# Patient Record
Sex: Male | Born: 1950 | Race: White | Hispanic: No | Marital: Single | Smoking: Never smoker
Health system: Southern US, Community
[De-identification: ages and names within clinical notes are randomized; demographics above are authoritative.]

## PROBLEM LIST (undated history)

## (undated) DIAGNOSIS — G35 Multiple sclerosis: Secondary | ICD-10-CM

## (undated) DIAGNOSIS — G35D Multiple sclerosis, unspecified: Secondary | ICD-10-CM

---

## 2017-08-07 ENCOUNTER — Encounter (HOSPITAL_COMMUNITY): Payer: Self-pay | Admitting: Emergency Medicine

## 2017-08-07 ENCOUNTER — Emergency Department (HOSPITAL_COMMUNITY): Payer: Medicare Other

## 2017-08-07 ENCOUNTER — Emergency Department (HOSPITAL_COMMUNITY)
Admission: EM | Admit: 2017-08-07 | Discharge: 2017-08-07 | Disposition: A | Discharge status: Home / Self Care | Payer: Medicare Other | Attending: Emergency Medicine | Admitting: Emergency Medicine

## 2017-08-07 DIAGNOSIS — G35 Multiple sclerosis: Secondary | ICD-10-CM | POA: Insufficient documentation

## 2017-08-07 DIAGNOSIS — R55 Syncope and collapse: Secondary | ICD-10-CM | POA: Diagnosis present

## 2017-08-07 DIAGNOSIS — R569 Unspecified convulsions: Secondary | ICD-10-CM | POA: Insufficient documentation

## 2017-08-07 HISTORY — DX: Multiple sclerosis: G35

## 2017-08-07 HISTORY — DX: Multiple sclerosis, unspecified: G35.D

## 2017-08-07 LAB — COMPREHENSIVE METABOLIC PANEL
ALT: 20 U/L (ref 17–63)
AST: 27 U/L (ref 15–41)
Albumin: 3.5 g/dL (ref 3.5–5.0)
Alkaline Phosphatase: 82 U/L (ref 38–126)
Anion gap: 11 (ref 5–15)
BILIRUBIN TOTAL: 0.5 mg/dL (ref 0.3–1.2)
BUN: 18 mg/dL (ref 6–20)
CALCIUM: 8.6 mg/dL — AB (ref 8.9–10.3)
CO2: 25 mmol/L (ref 22–32)
Chloride: 98 mmol/L — ABNORMAL LOW (ref 101–111)
Creatinine, Ser: 1.19 mg/dL (ref 0.61–1.24)
GFR calc Af Amer: >60 mL/min (ref >60)
Glucose, Bld: 107 mg/dL — ABNORMAL HIGH (ref 65–99)
POTASSIUM: 3.8 mmol/L (ref 3.5–5.1)
Sodium: 134 mmol/L — ABNORMAL LOW (ref 135–145)
TOTAL PROTEIN: 6.7 g/dL (ref 6.5–8.1)

## 2017-08-07 LAB — RAPID URINE DRUG SCREEN, HOSP PERFORMED
AMPHETAMINES: NOT DETECTED
BENZODIAZEPINES: NOT DETECTED
Barbiturates: NOT DETECTED
COCAINE: NOT DETECTED
OPIATES: NOT DETECTED
Tetrahydrocannabinol: NOT DETECTED

## 2017-08-07 LAB — CBC WITH DIFFERENTIAL/PLATELET
BASOS ABS: 0 10*3/uL (ref 0.0–0.1)
BASOS PCT: 0 %
EOS ABS: 0.1 10*3/uL (ref 0.0–0.7)
EOS PCT: 1 %
HCT: 36.9 % — ABNORMAL LOW (ref 39.0–52.0)
Hemoglobin: 11.5 g/dL — ABNORMAL LOW (ref 13.0–17.0)
LYMPHS PCT: 10 %
Lymphs Abs: 0.8 10*3/uL (ref 0.7–4.0)
MCH: 22.6 pg — ABNORMAL LOW (ref 26.0–34.0)
MCHC: 31.2 g/dL (ref 30.0–36.0)
MCV: 72.5 fL — AB (ref 78.0–100.0)
MONO ABS: 0.6 10*3/uL (ref 0.1–1.0)
Monocytes Relative: 8 %
Neutro Abs: 6 10*3/uL (ref 1.7–7.7)
Neutrophils Relative %: 81 %
PLATELETS: 297 10*3/uL (ref 150–400)
RBC: 5.09 MIL/uL (ref 4.22–5.81)
RDW: 16.2 % — AB (ref 11.5–15.5)
WBC: 7.5 10*3/uL (ref 4.0–10.5)

## 2017-08-07 LAB — URINALYSIS, ROUTINE W REFLEX MICROSCOPIC
BILIRUBIN URINE: NEGATIVE
GLUCOSE, UA: NEGATIVE mg/dL
HGB URINE DIPSTICK: NEGATIVE
Ketones, ur: NEGATIVE mg/dL
Leukocytes, UA: NEGATIVE
Nitrite: NEGATIVE
PH: 5 (ref 5.0–8.0)
Protein, ur: NEGATIVE mg/dL
SPECIFIC GRAVITY, URINE: 1.018 (ref 1.005–1.030)

## 2017-08-07 MED ORDER — LEVETIRACETAM 500 MG PO TABS
500.0000 mg | ORAL_TABLET | Freq: Once | ORAL | Status: AC
Start: 2017-08-07 — End: 2017-08-07
  Administered 2017-08-07: 500 mg via ORAL
  Filled 2017-08-07: qty 1

## 2017-08-07 MED ORDER — LEVETIRACETAM 500 MG PO TABS: 500.0000 mg | ORAL_TABLET | Freq: Two times a day (BID) | ORAL | 0 refills | Status: AC

## 2017-08-07 MED ORDER — DEXTROSE 5 % IV SOLN
0.5000 mg/min | INTRAVENOUS | Status: DC
  Filled 2017-08-07: qty 100

## 2017-08-07 NOTE — ED Triage Notes (Signed)
Pt here with c/o syncopal episode while riding in the car with his wife , pt was out for about 5 mins , pt has MS and has a lot of chronic UTI's

## 2017-08-07 NOTE — ED Provider Notes (Signed)
[facility] EMERGENCY DEPARTMENT Provider Note   CSN: [id number] Arrival date & time: 08/07/17  1145     History   Chief Complaint Chief Complaint  Patient presents with  . Near Syncope    HPI James Jefferson is a 68 y.o. male.  History obtained from patient and from patient's wife.  Patient became unresponsive while riding in the car today at 11 AM approximately 5 minutes during which time his right arm was "stiff out in front of him".  Patient denies headache.  Is now asymptomatic and looks at baseline per his wife.  It took several minutes until he regained consciousness fully.  No other associated symptoms.  His wife reports that he may have had a similar episode several months ago.  Brought by EMS.  No treatment prior to coming denies headache denies chest pain denies abdominal pain  HPI  Past Medical History:  Diagnosis Date  . MS (multiple sclerosis) (HCC)     There are no active problems to display for this patient.   Recurrent urinary tract infections Kidney stones      Home Medications   Macrobid Prior to Admission medications   Not on File    Family History No family history on file.  Social History Social History   Tobacco Use  . Smoking status: Never Smoker  Substance Use Topics  . Alcohol use: Never    Frequency: Never  . Drug use: Never     Allergies   Patient has no known allergies.   Review of Systems Review of Systems  Genitourinary:       Patient started himself on Macrobid 2 weeks ago as he felt he might be developing a urinary tract infection as he noted foul-smelling urine  Musculoskeletal: Positive for gait problem.       Wheelchair-bound  Neurological:       Quadriparesis, chronic  All other systems reviewed and are negative.    Physical Exam Updated Vital Signs BP (!) 131/39 (BP Location: Left Arm)   Pulse 68   Temp 97.9 F (36.6 C) (Oral)   Resp 20   SpO2 100%   Physical Exam    Constitutional: He is oriented to person, place, and time. He appears well-developed and well-nourished.  HENT:  Head: Normocephalic and atraumatic.  Eyes: Pupils are equal, round, and reactive to light. Conjunctivae are normal.  Neck: Neck supple. No tracheal deviation present. No thyromegaly present.  Cardiovascular: Normal rate and regular rhythm.  No murmur heard. Pulmonary/Chest: Effort normal and breath sounds normal.  Abdominal: Soft. Bowel sounds are normal. He exhibits no distension. There is no tenderness.  Musculoskeletal: Normal range of motion. He exhibits no edema or tenderness.  Neurological: He is alert and oriented to person, place, and time. Coordination normal.  Able to move all extremities but weak in all extremities.  Quadriparesis  Skin: Skin is warm and dry. Capillary refill takes less than 2 seconds. No rash noted.  Psychiatric: He has a normal mood and affect.  Nursing note and vitals reviewed.    ED Treatments / Results  Labs (all labs ordered are listed, but only abnormal results are displayed) Labs Reviewed  COMPREHENSIVE METABOLIC PANEL  CBC WITH DIFFERENTIAL/PLATELET  RAPID URINE DRUG SCREEN, HOSP PERFORMED    EKG None  EKG Interpretation  Date/Time:  Saturday August 07 2017 12:45:41 EDT Ventricular Rate:  75 PR Interval:    QRS Duration: 87 QT Interval:  393 QTC Calculation: 439 R Axis:  26 Text Interpretation:  Sinus rhythm No old tracing to compare Confirmed by [place], Doreatha Martin [phone]) on 08/07/2017 12:49:41 PM       Radiology No results found.  Procedures Procedures (including critical care time) Results for orders placed or performed during the hospital encounter of 08/07/17  Comprehensive metabolic panel  Result Value Ref Range   Sodium 134 (L) 135 - 145 mmol/L   Potassium 3.8 3.5 - 5.1 mmol/L   Chloride 98 (L) 101 - 111 mmol/L   CO2 25 22 - 32 mmol/L   Glucose, Bld 107 (H) 65 - 99 mg/dL   BUN 18 6 - 20 mg/dL   Creatinine,  Ser 6.04 0.61 - 1.24 mg/dL   Calcium 8.6 (L) 8.9 - 10.3 mg/dL   Total Protein 6.7 6.5 - 8.1 g/dL   Albumin 3.5 3.5 - 5.0 g/dL   AST 27 15 - 41 U/L   ALT 20 17 - 63 U/L   Alkaline Phosphatase 82 38 - 126 U/L   Total Bilirubin 0.5 0.3 - 1.2 mg/dL   GFR calc non Af Amer >60 >60 mL/min   GFR calc Af Amer >60 >60 mL/min   Anion gap 11 5 - 15  CBC with Differential/Platelet  Result Value Ref Range   WBC 7.5 4.0 - 10.5 K/uL   RBC 5.09 4.22 - 5.81 MIL/uL   Hemoglobin 11.5 (L) 13.0 - 17.0 g/dL   HCT 54.0 (L) 98.1 - 19.1 %   MCV 72.5 (L) 78.0 - 100.0 fL   MCH 22.6 (L) 26.0 - 34.0 pg   MCHC 31.2 30.0 - 36.0 g/dL   RDW 47.8 (H) 29.5 - 62.1 %   Platelets 297 150 - 400 K/uL   Neutrophils Relative % 81 %   Neutro Abs 6.0 1.7 - 7.7 K/uL   Lymphocytes Relative 10 %   Lymphs Abs 0.8 0.7 - 4.0 K/uL   Monocytes Relative 8 %   Monocytes Absolute 0.6 0.1 - 1.0 K/uL   Eosinophils Relative 1 %   Eosinophils Absolute 0.1 0.0 - 0.7 K/uL   Basophils Relative 0 %   Basophils Absolute 0.0 0.0 - 0.1 K/uL  Rapid urine drug screen (hospital performed)  Result Value Ref Range   Opiates NONE DETECTED NONE DETECTED   Cocaine NONE DETECTED NONE DETECTED   Benzodiazepines NONE DETECTED NONE DETECTED   Amphetamines NONE DETECTED NONE DETECTED   Tetrahydrocannabinol NONE DETECTED NONE DETECTED   Barbiturates NONE DETECTED NONE DETECTED  Urinalysis, Routine w reflex microscopic  Result Value Ref Range   Color, Urine YELLOW YELLOW   APPearance CLEAR CLEAR   Specific Gravity, Urine 1.018 1.005 - 1.030   pH 5.0 5.0 - 8.0   Glucose, UA NEGATIVE NEGATIVE mg/dL   Hgb urine dipstick NEGATIVE NEGATIVE   Bilirubin Urine NEGATIVE NEGATIVE   Ketones, ur NEGATIVE NEGATIVE mg/dL   Protein, ur NEGATIVE NEGATIVE mg/dL   Nitrite NEGATIVE NEGATIVE   Leukocytes, UA NEGATIVE NEGATIVE   Ct Head Wo Contrast  Result Date: 08/07/2017 CLINICAL DATA:  Syncopal episode. History of multiple sclerosis. 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 EXAM: CT HEAD WITHOUT CONTRAST TECHNIQUE: Contiguous axial images were obtained from the base of the skull through the vertex without intravenous contrast. COMPARISON:  Outside brain MRI report 12/19/2016. 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 The mass may be intrinsically  hyperdense rather than the density reflecting acute hemorrhage given the unchanged size of the mass. There is also no evidence of hemorrhage elsewhere in the ventricles. Elsewhere, there is no evidence of acute infarct, hemorrhage, midline shift, or extra-axial fluid collection. Mild cerebral atrophy is within normal limits for age. Vascular: Calcified atherosclerosis at the skull base. No hyperdense vessel. Skull: No fracture focal osseous lesion. Sinuses/Orbits: Paranasal sinuses and mastoid air cells are clear. Unremarkable orbits. Other: None. IMPRESSION: 1. No evidence of acute intracranial abnormality. 2. Chronic right lateral intraventricular mass. Electronically Signed   By: Sebastian Ache M.D.   On: 08/07/2017 14:52    Medications Ordered in ED Medications - No data to display  Wellbutrin 300 mg Initial Impression / Assessment and Plan / ED Course  I have reviewed the triage vital signs and the nursing notes.  Pertinent labs & imaging results that were available during my care of the patient were reviewed by me and considered in my medical decision making (see chart for details).     3:40 PM patient resting comfortably.  Asymptomatic. Spoke with Dr. Uvaldo Rising on-call for patient's neurologist Dr. Sherrine Maples  plan cut dosage of Wellbutrin to 150 mg daily.  Prescription Keppra 500 mg twice daily to be written by me.  First dose to be given in the emergency department.  Patient does not require load.  Clinically I suspect the patient had seizure follow-up  with Dr. Sherrine Maples Monday, 08/09/2017 Final Clinical Impressions(s) / ED Diagnoses  Dx seizure Final diagnoses:  None    ED Discharge Orders    None       Doug Sou, MD 08/07/17 [phone]

## 2017-08-07 NOTE — Discharge Instructions (Signed)
Decrease your Wellbutrin dose to 150 mg daily.  Start taking the seizure medication prescribed tomorrow.  Call Dr.Glenn on Monday 08/09/2017 to schedule next available office visit

## 2018-09-05 IMAGING — CT CT HEAD W/O CM
4 series · 15 of 47 positions shown, 17 images · non-contrast
Comparison: Outside brain MRI report 12/19/2016.

CLINICAL DATA: Syncopal episode. History of multiple sclerosis.
History of right lateral intraventricular mass status post gamma
knife therapy.

EXAM:
CT HEAD WITHOUT CONTRAST
TECHNIQUE: Contiguous axial images were obtained from the base of the skull
through the vertex without intravenous contrast.

[Series 3: head wo · axial · 0.47mm/px · z∈[-102,+28]mm · 7 of 36 slices shown, 9 images]
[im 5/36  brain]
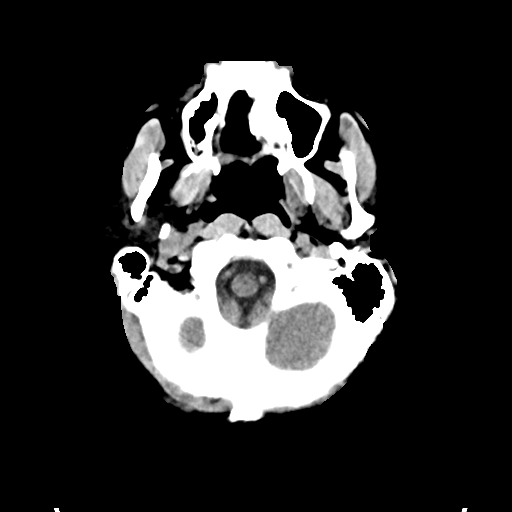
[im 5/36  bone]
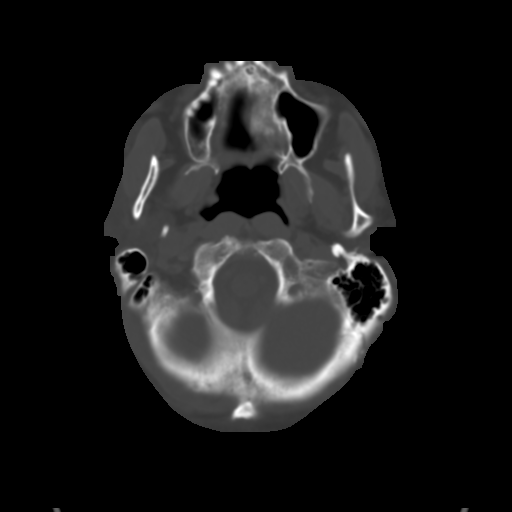
[im 9/36  brain]
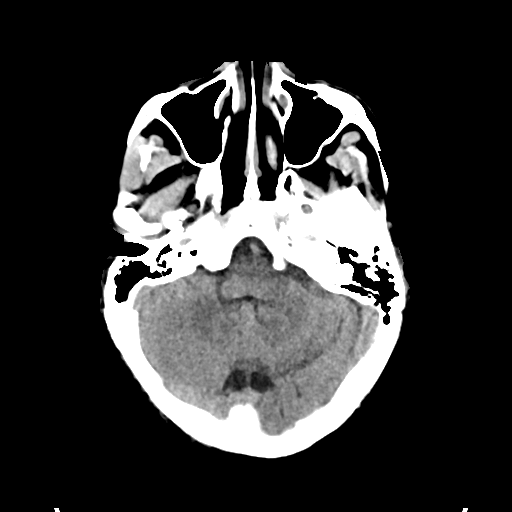
[im 14/36  brain]
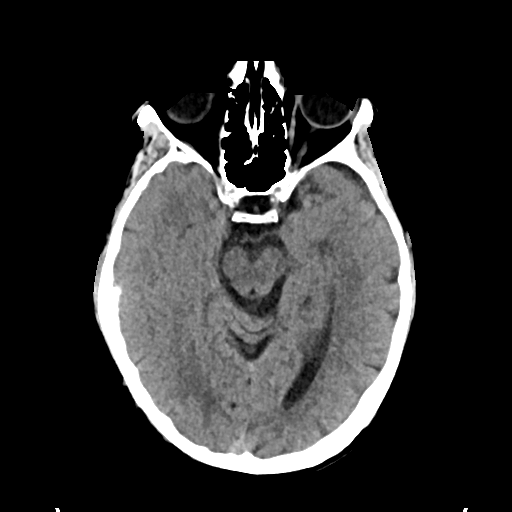
[im 18/36  brain]
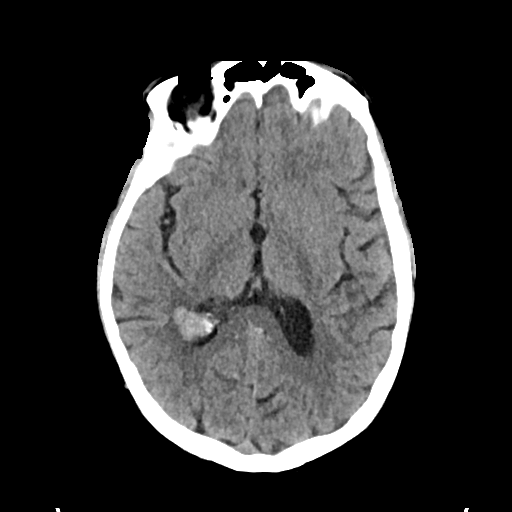
[im 22/36  brain]
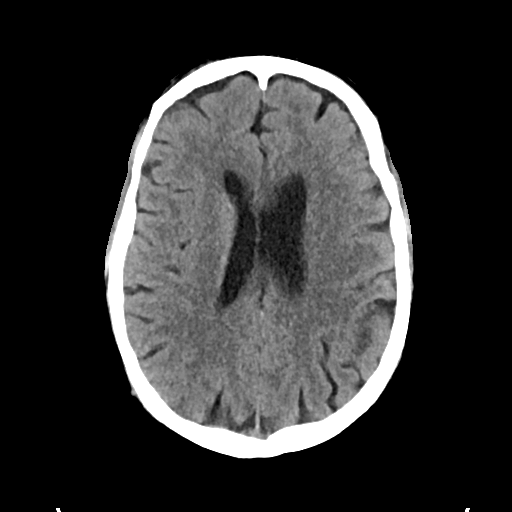
[im 22/36  bone]
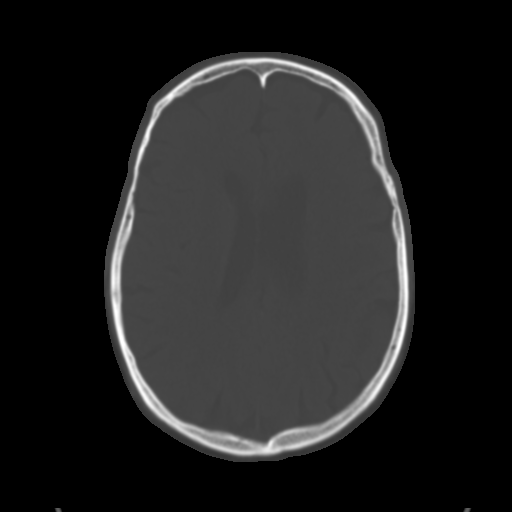
[im 27/36  brain]
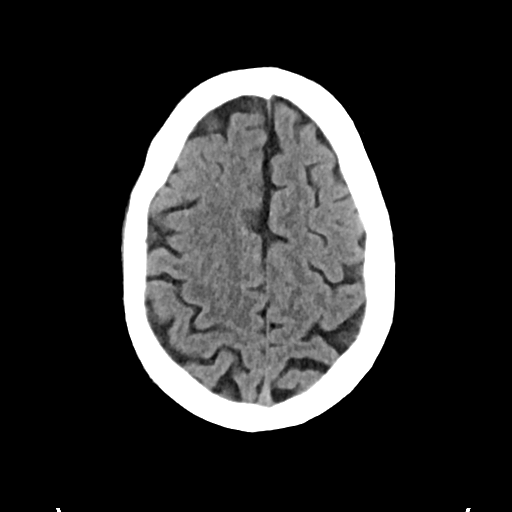
[im 31/36  brain]
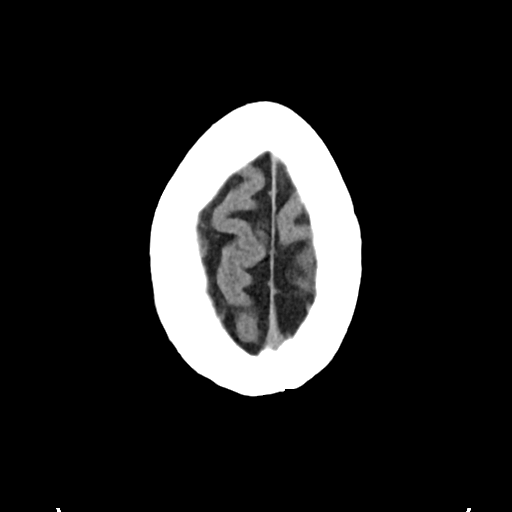

[Series 4: head bone · axial · 0.47mm/px · z∈[-106,-88]mm · 2 of 89 slices shown]
[im 9/89  bone]
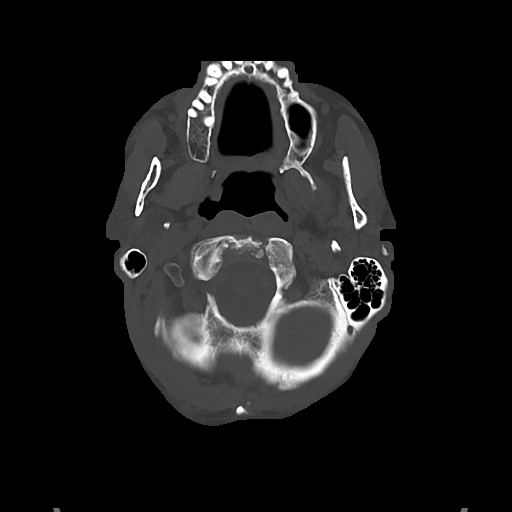
[im 18/89  bone]
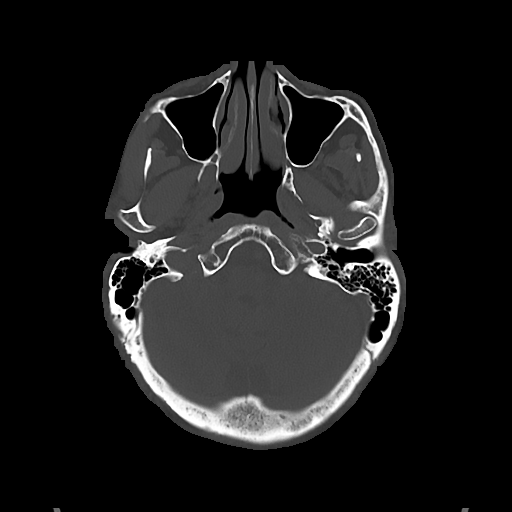

[Series 5: cor soft · coronal · 0.35mm/px · 3 of 71 slices shown]
[im 24/71  brain]
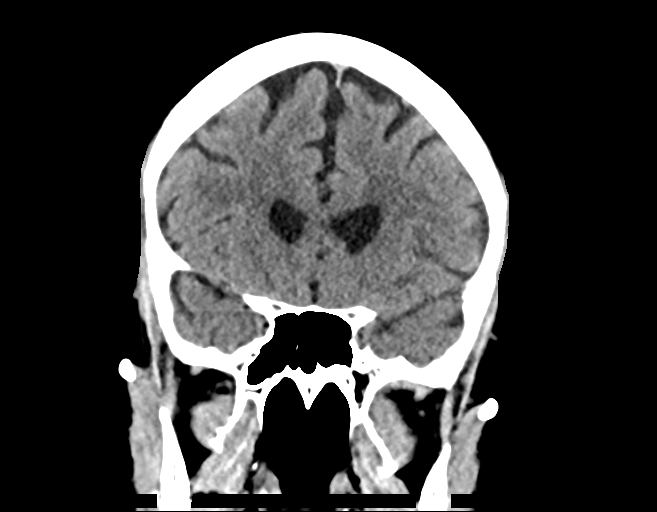
[im 32/71  brain]
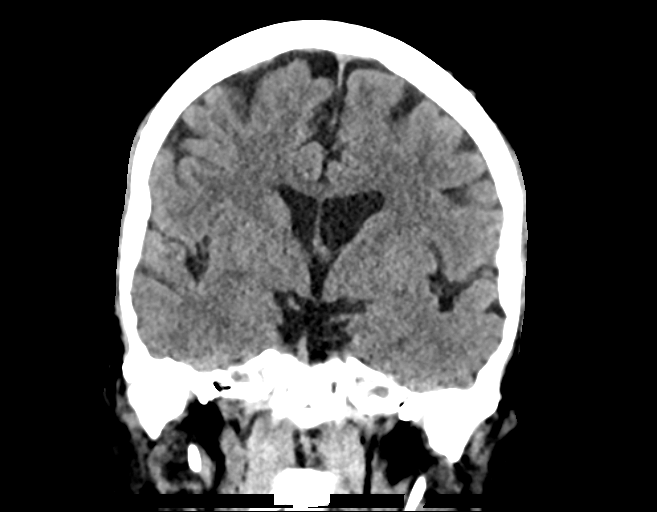
[im 39/71  brain]
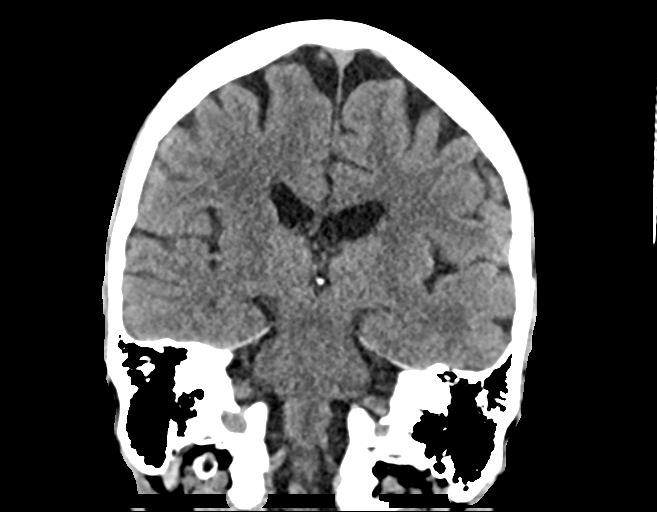

[Series 6: sag soft · sagittal · 0.35mm/px · 3 of 65 slices shown]
[im 22/65  brain]
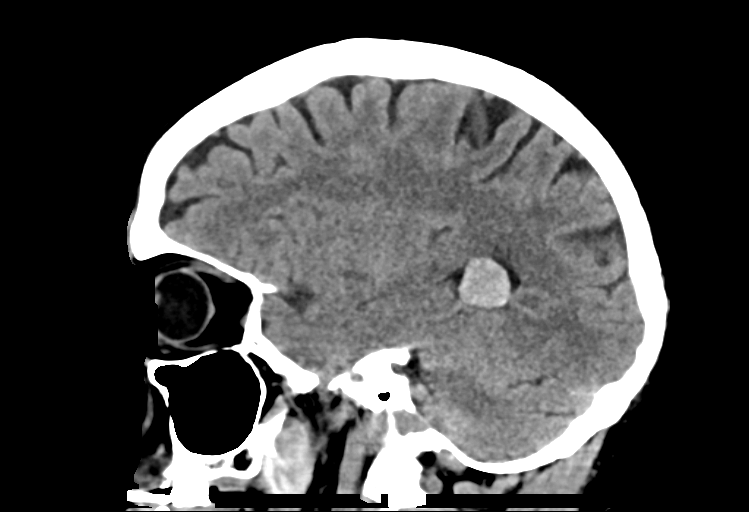
[im 33/65  brain]
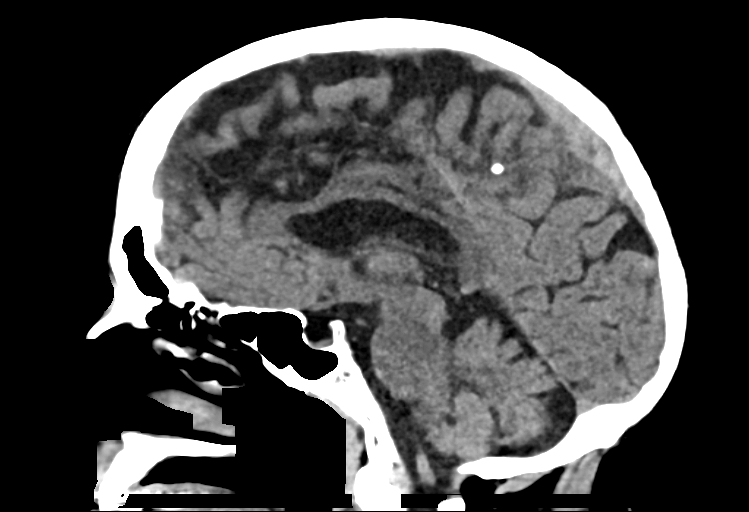
[im 43/65  brain]
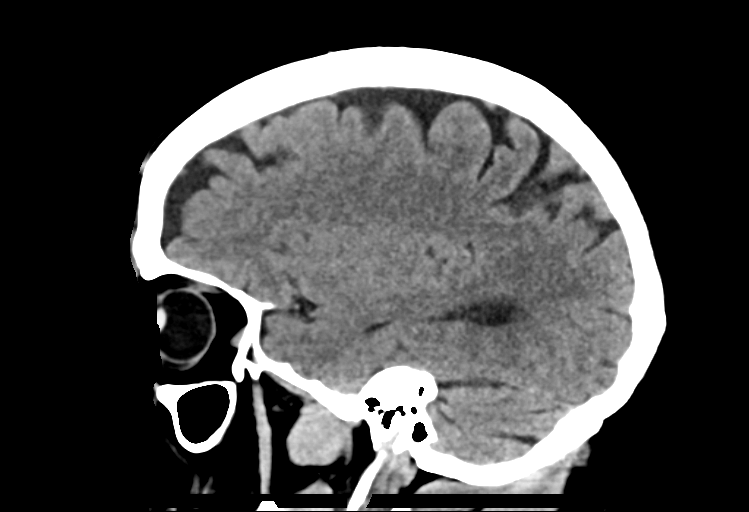

[15 of 47 positions shown; findings below may reference images not displayed]

FINDINGS: Brain: There is a hyperdense 2.1 x 1.8 cm mass in the atrium of the
right lateral ventricle (the outside MRI describes a 2 cm
intraventricular meningioma). The mass may be intrinsically
hyperdense rather than the density reflecting acute hemorrhage given
the unchanged size of the mass. There is also no evidence of
hemorrhage elsewhere in the ventricles. Elsewhere, there is no
evidence of acute infarct, hemorrhage, midline shift, or extra-axial
fluid collection. Mild cerebral atrophy is within normal limits for
age.

Vascular: Calcified atherosclerosis at the skull base. No hyperdense
vessel.

Skull: No fracture focal osseous lesion.

Sinuses/Orbits: Paranasal sinuses and mastoid air cells are clear.
Unremarkable orbits.

Other: None.
IMPRESSION: 1. No evidence of acute intracranial abnormality.
2. Chronic right lateral intraventricular mass.

## 2024-06-11 DEATH — deceased
